# Patient Record
Sex: Male | Born: 2000 | Race: White | Hispanic: No | Marital: Single | Smoking: Never smoker
Health system: Southern US, Community
[De-identification: ages and names within clinical notes are randomized; demographics above are authoritative.]

## PROBLEM LIST (undated history)

## (undated) DIAGNOSIS — N200 Calculus of kidney: Secondary | ICD-10-CM

## (undated) HISTORY — DX: Calculus of kidney: N20.0

---
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
## 2020-05-17 ENCOUNTER — Other Ambulatory Visit: Payer: Self-pay | Admitting: Physician Assistant

## 2020-05-17 DIAGNOSIS — R112 Nausea with vomiting, unspecified: Secondary | ICD-10-CM

## 2020-05-24 ENCOUNTER — Ambulatory Visit
Admission: RE | Admit: 2020-05-24 | Discharge: 2020-05-24 | Disposition: A | Discharge status: Home / Self Care | Payer: Managed Care, Other (non HMO) | Source: Ambulatory Visit | Attending: Physician Assistant | Admitting: Physician Assistant

## 2020-05-24 DIAGNOSIS — R112 Nausea with vomiting, unspecified: Secondary | ICD-10-CM

## 2020-07-07 ENCOUNTER — Other Ambulatory Visit: Payer: Self-pay | Admitting: Physician Assistant

## 2020-07-07 ENCOUNTER — Ambulatory Visit
Admission: RE | Admit: 2020-07-07 | Discharge: 2020-07-07 | Disposition: A | Discharge status: Home / Self Care | Payer: Managed Care, Other (non HMO) | Source: Ambulatory Visit | Attending: Physician Assistant | Admitting: Physician Assistant

## 2020-07-07 DIAGNOSIS — R109 Unspecified abdominal pain: Secondary | ICD-10-CM

## 2020-07-07 DIAGNOSIS — K5900 Constipation, unspecified: Secondary | ICD-10-CM

## 2020-10-01 IMAGING — CR DG ABDOMEN 1V
1 series · 1 of 1 positions shown · non-contrast
Comparison: None.

CLINICAL DATA: Abdomen pain

EXAM:
ABDOMEN - 1 VIEW

[t abdomen supine]
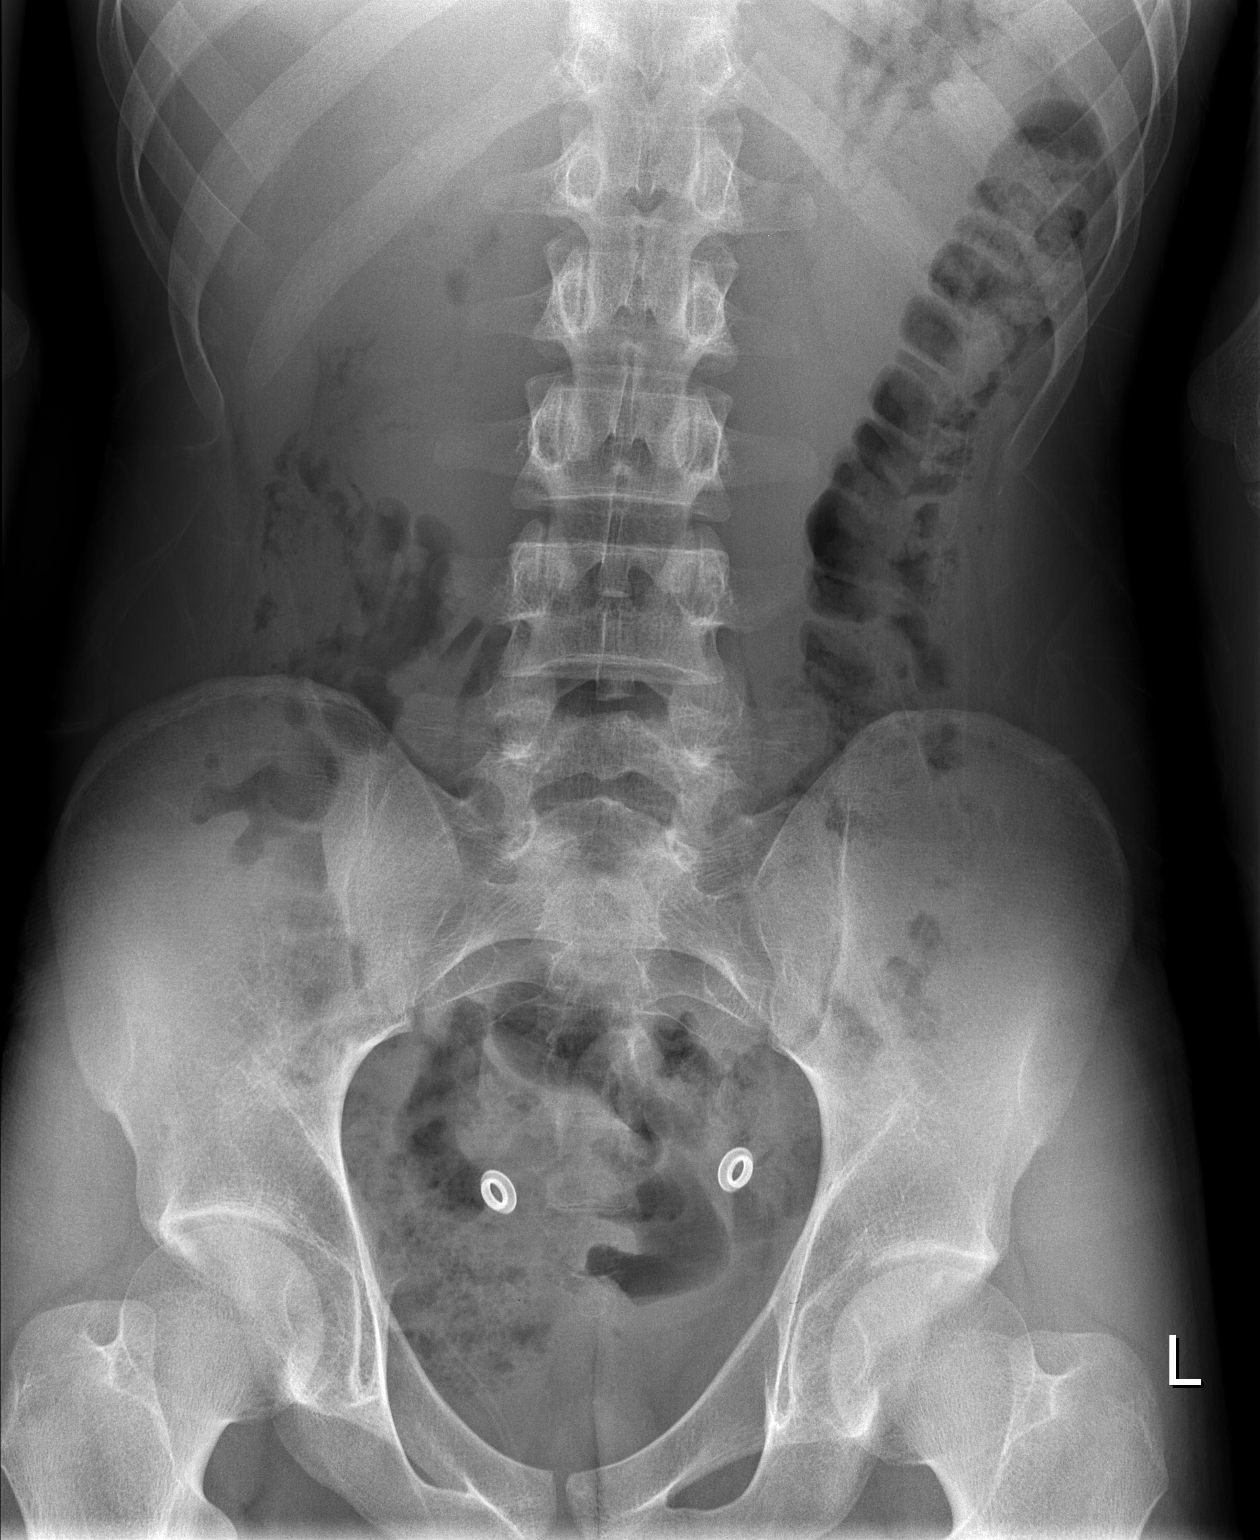

[1 of 1 positions shown; findings below may reference images not displayed]

FINDINGS: The bowel gas pattern is normal. No radio-opaque calculi or other
significant radiographic abnormality are seen. Metallic
snaps/suspected artifact at the pelvis.
IMPRESSION: Negative.

## 2023-05-31 ENCOUNTER — Other Ambulatory Visit: Payer: Self-pay

## 2023-05-31 ENCOUNTER — Encounter (HOSPITAL_COMMUNITY): Payer: Self-pay | Admitting: *Deleted

## 2023-05-31 ENCOUNTER — Emergency Department (HOSPITAL_COMMUNITY)
Admission: EM | Admit: 2023-05-31 | Discharge: 2023-05-31 | Disposition: A | Discharge status: Home / Self Care | Payer: Managed Care, Other (non HMO) | Attending: Student | Admitting: Student

## 2023-05-31 DIAGNOSIS — R1031 Right lower quadrant pain: Secondary | ICD-10-CM | POA: Insufficient documentation

## 2023-05-31 DIAGNOSIS — R1030 Lower abdominal pain, unspecified: Secondary | ICD-10-CM | POA: Insufficient documentation

## 2023-05-31 DIAGNOSIS — Z87442 Personal history of urinary calculi: Secondary | ICD-10-CM | POA: Insufficient documentation

## 2023-05-31 DIAGNOSIS — R350 Frequency of micturition: Secondary | ICD-10-CM | POA: Diagnosis not present

## 2023-05-31 DIAGNOSIS — R109 Unspecified abdominal pain: Secondary | ICD-10-CM

## 2023-05-31 DIAGNOSIS — M549 Dorsalgia, unspecified: Secondary | ICD-10-CM | POA: Insufficient documentation

## 2023-05-31 LAB — CBC WITH DIFFERENTIAL/PLATELET
Abs Immature Granulocytes: 0 10*3/uL (ref 0.00–0.07)
Basophils Absolute: 0 10*3/uL (ref 0.0–0.1)
Basophils Relative: 1 %
Eosinophils Absolute: 0.1 10*3/uL (ref 0.0–0.5)
Eosinophils Relative: 2 %
HCT: 37.9 % — ABNORMAL LOW (ref 39.0–52.0)
Hemoglobin: 12.8 g/dL — ABNORMAL LOW (ref 13.0–17.0)
Immature Granulocytes: 0 %
Lymphocytes Relative: 38 %
Lymphs Abs: 1.3 10*3/uL (ref 0.7–4.0)
MCH: 31.1 pg (ref 26.0–34.0)
MCHC: 33.8 g/dL (ref 30.0–36.0)
MCV: 92 fL (ref 80.0–100.0)
Monocytes Absolute: 0.3 10*3/uL (ref 0.1–1.0)
Monocytes Relative: 8 %
Neutro Abs: 1.7 10*3/uL (ref 1.7–7.7)
Neutrophils Relative %: 51 %
Platelets: 240 10*3/uL (ref 150–400)
RBC: 4.12 MIL/uL — ABNORMAL LOW (ref 4.22–5.81)
RDW: 12 % (ref 11.5–15.5)
WBC: 3.4 10*3/uL — ABNORMAL LOW (ref 4.0–10.5)
nRBC: 0 % (ref 0.0–0.2)

## 2023-05-31 LAB — URINALYSIS, ROUTINE W REFLEX MICROSCOPIC
Bacteria, UA: NONE SEEN
Bilirubin Urine: NEGATIVE
Glucose, UA: NEGATIVE mg/dL
Ketones, ur: 5 mg/dL — AB
Nitrite: NEGATIVE
Protein, ur: NEGATIVE mg/dL
Specific Gravity, Urine: 1.016 (ref 1.005–1.030)
pH: 5 (ref 5.0–8.0)

## 2023-05-31 LAB — COMPREHENSIVE METABOLIC PANEL
ALT: 25 U/L (ref 0–44)
AST: 50 U/L — ABNORMAL HIGH (ref 15–41)
Albumin: 4.3 g/dL (ref 3.5–5.0)
Alkaline Phosphatase: 54 U/L (ref 38–126)
Anion gap: 11 (ref 5–15)
BUN: 16 mg/dL (ref 6–20)
CO2: 20 mmol/L — ABNORMAL LOW (ref 22–32)
Calcium: 9 mg/dL (ref 8.9–10.3)
Chloride: 106 mmol/L (ref 98–111)
Creatinine, Ser: 0.98 mg/dL (ref 0.61–1.24)
GFR, Estimated: >60 mL/min (ref >60)
Glucose, Bld: 92 mg/dL (ref 70–99)
Potassium: 3.5 mmol/L (ref 3.5–5.1)
Sodium: 137 mmol/L (ref 135–145)
Total Bilirubin: 0.4 mg/dL (ref 0.3–1.2)
Total Protein: 6.3 g/dL — ABNORMAL LOW (ref 6.5–8.1)

## 2023-05-31 MED ORDER — KETOROLAC TROMETHAMINE 15 MG/ML IJ SOLN
15.0000 mg | Freq: Once | INTRAMUSCULAR | Status: AC
  Administered 2023-05-31: 15 mg via INTRAVENOUS
  Filled 2023-05-31: qty 1

## 2023-05-31 MED ORDER — KETOROLAC TROMETHAMINE 10 MG PO TABS: 10.0000 mg | ORAL_TABLET | Freq: Four times a day (QID) | ORAL | 0 refills | Status: AC | PRN

## 2023-05-31 MED ORDER — ONDANSETRON 4 MG PO TBDP: 4.0000 mg | ORAL_TABLET | Freq: Three times a day (TID) | ORAL | 0 refills | Status: AC | PRN

## 2023-05-31 MED ORDER — SODIUM CHLORIDE 0.9 % IV BOLUS
1000.0000 mL | Freq: Once | INTRAVENOUS | Status: AC
  Administered 2023-05-31: 1000 mL via INTRAVENOUS

## 2023-05-31 MED ORDER — TAMSULOSIN HCL 0.4 MG PO CAPS: 0.4000 mg | ORAL_CAPSULE | Freq: Every day | ORAL | 0 refills | Status: AC

## 2023-05-31 MED ORDER — TAMSULOSIN HCL 0.4 MG PO CAPS
0.4000 mg | ORAL_CAPSULE | Freq: Once | ORAL | Status: AC
  Administered 2023-05-31: 0.4 mg via ORAL
  Filled 2023-05-31: qty 1

## 2023-05-31 NOTE — ED Triage Notes (Signed)
States approx. 30 mins ago c/o right lower quad pain that radiating into his back states upo arrival to ed pain has subsided some. States he has actually had the pain for about 9 days but not this bad. C/o nausea no vomiting.

## 2023-05-31 NOTE — ED Notes (Signed)
Pt provided a strainer.

## 2023-05-31 NOTE — Discharge Instructions (Addendum)
Please follow-up with urology as directed.  Return to the ER if you have worsening pain, difficulty urinating, or cannot urinate at all.  Please strain your urine, and use the Toradol for pain control, you should not use ibuprofen with the Toradol or other anti-inflammatories.

## 2023-05-31 NOTE — ED Provider Notes (Signed)
[place] EMERGENCY DEPARTMENT AT [facility] Provider Note   CSN: [id number] Arrival date & time: 05/31/23  1043     History  Chief Complaint  Patient presents with   Abdominal Pain    Seth Bowman is a 22 y.o. male, history of nephrolithiasis, who presents to the ED secondary to right lower quadrant abdominal pain, radiating to the back has been going on for the past day.  He states for the past 9 days, he feels like he has been having kidney stone and having some back pain, that would intermittently come and go, states that last night it became very bad and was the right lower quadrant radiating to his back, with associated nausea, he states that it continued until this morning, until he came into the ER, and now it is settled to more his bladder area.  He endorses now increased frequency, urgency of urine.  Denies any hematuria.  States it feels very similar to his past kidney stone.  Has been referred to urology, but has not been seen yet.  Denies any fevers or chills.     Home Medications Prior to Admission medications   Medication Sig Start Date End Date Taking? Authorizing Provider  ketorolac (TORADOL) 10 MG tablet Take 1 tablet (10 mg total) by mouth every 6 (six) hours as needed. 05/31/23  Yes Demario Faniel L, PA  ondansetron (ZOFRAN-ODT) 4 MG disintegrating tablet Take 1 tablet (4 mg total) by mouth every 8 (eight) hours as needed for nausea or vomiting. 05/31/23  Yes Lakechia Nay L, PA  tamsulosin (FLOMAX) 0.4 MG CAPS capsule Take 1 capsule (0.4 mg total) by mouth daily. 05/31/23  Yes Lilton Pare, Harley Alto, PA      Allergies    Patient has no allergy information on record.    Review of Systems   Review of Systems  Gastrointestinal:  Positive for abdominal pain and nausea. Negative for vomiting.    Physical Exam Updated Vital Signs BP 130/84 (BP Location: Right Arm)   Pulse 77   Temp 97.8 F (36.6 C)   Resp 18   Ht 5\' 10"  (1.778 m)   Wt 58.5 kg   SpO2 100%    BMI 18.51 kg/m  Physical Exam Vitals and nursing note reviewed.  Constitutional:      General: He is not in acute distress.    Appearance: He is well-developed.  HENT:     Head: Normocephalic and atraumatic.  Eyes:     Conjunctiva/sclera: Conjunctivae normal.  Cardiovascular:     Rate and Rhythm: Normal rate and regular rhythm.     Heart sounds: No murmur heard. Pulmonary:     Effort: Pulmonary effort is normal. No respiratory distress.     Breath sounds: Normal breath sounds.  Abdominal:     Palpations: Abdomen is soft.     Tenderness: There is abdominal tenderness in the suprapubic area.  Musculoskeletal:        General: No swelling.     Cervical back: Neck supple.  Skin:    General: Skin is warm and dry.     Capillary Refill: Capillary refill takes less than 2 seconds.  Neurological:     Mental Status: He is alert.  Psychiatric:        Mood and Affect: Mood normal.     ED Results / Procedures / Treatments   Labs (all labs ordered are listed, but only abnormal results are displayed) Labs Reviewed  URINALYSIS, ROUTINE W REFLEX MICROSCOPIC -  Abnormal; Notable for the following components:      Result Value   Hgb urine dipstick MODERATE (*)    Ketones, ur 5 (*)    Leukocytes,Ua TRACE (*)    All other components within normal limits  CBC WITH DIFFERENTIAL/PLATELET - Abnormal; Notable for the following components:   WBC 3.4 (*)    RBC 4.12 (*)    Hemoglobin 12.8 (*)    HCT 37.9 (*)    All other components within normal limits  COMPREHENSIVE METABOLIC PANEL - Abnormal; Notable for the following components:   CO2 20 (*)    Total Protein 6.3 (*)    AST 50 (*)    All other components within normal limits    EKG None  Radiology No results found.  Procedures Procedures    Medications Ordered in ED Medications  ketorolac (TORADOL) 15 MG/ML injection 15 mg (15 mg Intravenous Given 05/31/23 1143)  sodium chloride 0.9 % bolus 1,000 mL (1,000 mLs Intravenous  New Bag/Given 05/31/23 1144)  tamsulosin (FLOMAX) capsule 0.4 mg (0.4 mg Oral Given 05/31/23 1143)    ED Course/ Medical Decision Making/ A&P                             Medical Decision Making Patient is a 22 year old male, history of kidney stones, here for Right Flank Pain This Time For the past Day, to his bladder.  He states he is feeling better now, after admitted to his bladder, thinks it is not a kidney stone.  Here for medication management he declines any imaging, labs obtained, to evaluate kidney function, and the presence of UTI.  He is not in distress  Amount and/or Complexity of Data Reviewed Labs: ordered.    Details: Creatinine within normal limits, urinalysis unremarkable except for trace amount of blood Discussion of management or test interpretation with external provider(s): Discussed with patient, his creatinine is within normal limits urinalysis unremarkable, he does have blood in his urine, however this aligns with a kidney stone.  And he is in no discomfort, at currently, we will send him home with Toradol, Flomax, and follow-up with urology.  He has already got an appointment the with urology, she is waiting to get in.  Discharged home with strict return precautions  Risk Prescription drug management.    Final Clinical Impression(s) / ED Diagnoses Final diagnoses:  Flank pain    Rx / DC Orders ED Discharge Orders          Ordered    tamsulosin (FLOMAX) 0.4 MG CAPS capsule  Daily        05/31/23 1237    ketorolac (TORADOL) 10 MG tablet  Every 6 hours PRN        05/31/23 1237    ondansetron (ZOFRAN-ODT) 4 MG disintegrating tablet  Every 8 hours PRN        05/31/23 1237              Omid Deardorff Elbert Ewings, PA 05/31/23 1239    Glendora Score, MD 05/31/23 [phone]

## 2023-06-06 ENCOUNTER — Other Ambulatory Visit: Payer: Self-pay | Admitting: Physician Assistant

## 2023-06-06 DIAGNOSIS — R109 Unspecified abdominal pain: Secondary | ICD-10-CM

## 2023-06-26 ENCOUNTER — Ambulatory Visit
Admission: RE | Admit: 2023-06-26 | Discharge: 2023-06-26 | Disposition: A | Discharge status: Home / Self Care | Payer: Managed Care, Other (non HMO) | Source: Ambulatory Visit | Attending: Physician Assistant | Admitting: Physician Assistant

## 2023-06-26 DIAGNOSIS — R109 Unspecified abdominal pain: Secondary | ICD-10-CM

## 2023-06-26 MED ORDER — IOPAMIDOL (ISOVUE-300) INJECTION 61%
100.0000 mL | Freq: Once | INTRAVENOUS | Status: AC | PRN
  Administered 2023-06-26: 100 mL via INTRAVENOUS
